# Patient Record
Sex: Female | Born: 2000 | Race: Black or African American | Hispanic: No | Marital: Single | Smoking: Never smoker
Health system: Southern US, Community
[De-identification: ages and names within clinical notes are randomized; demographics above are authoritative.]

## PROBLEM LIST (undated history)

## (undated) DIAGNOSIS — R569 Unspecified convulsions: Secondary | ICD-10-CM

## (undated) HISTORY — PX: DENTAL SURGERY: SHX609

---

## 2019-05-17 ENCOUNTER — Emergency Department (HOSPITAL_COMMUNITY)
Admission: EM | Admit: 2019-05-17 | Discharge: 2019-05-17 | Disposition: A | Discharge status: Home / Self Care | Payer: Medicaid - Out of State | Attending: Emergency Medicine | Admitting: Emergency Medicine

## 2019-05-17 ENCOUNTER — Emergency Department (HOSPITAL_COMMUNITY): Payer: Medicaid - Out of State

## 2019-05-17 ENCOUNTER — Encounter (HOSPITAL_COMMUNITY): Payer: Self-pay

## 2019-05-17 ENCOUNTER — Other Ambulatory Visit: Payer: Self-pay

## 2019-05-17 DIAGNOSIS — R55 Syncope and collapse: Secondary | ICD-10-CM | POA: Diagnosis not present

## 2019-05-17 DIAGNOSIS — R569 Unspecified convulsions: Secondary | ICD-10-CM | POA: Diagnosis not present

## 2019-05-17 HISTORY — DX: Unspecified convulsions: R56.9

## 2019-05-17 LAB — CBC WITH DIFFERENTIAL/PLATELET
Abs Immature Granulocytes: 0.01 10*3/uL (ref 0.00–0.07)
Basophils Absolute: 0 10*3/uL (ref 0.0–0.1)
Basophils Relative: 0 %
Eosinophils Absolute: 0 10*3/uL (ref 0.0–0.5)
Eosinophils Relative: 1 %
HCT: 37.4 % (ref 36.0–46.0)
Hemoglobin: 12.1 g/dL (ref 12.0–15.0)
Immature Granulocytes: 0 %
Lymphocytes Relative: 37 %
Lymphs Abs: 2.5 10*3/uL (ref 0.7–4.0)
MCH: 31.3 pg (ref 26.0–34.0)
MCHC: 32.4 g/dL (ref 30.0–36.0)
MCV: 96.6 fL (ref 80.0–100.0)
Monocytes Absolute: 0.4 10*3/uL (ref 0.1–1.0)
Monocytes Relative: 6 %
Neutro Abs: 4 10*3/uL (ref 1.7–7.7)
Neutrophils Relative %: 56 %
Platelets: 262 10*3/uL (ref 150–400)
RBC: 3.87 MIL/uL (ref 3.87–5.11)
RDW: 12.7 % (ref 11.5–15.5)
WBC: 6.9 10*3/uL (ref 4.0–10.5)
nRBC: 0 % (ref 0.0–0.2)

## 2019-05-17 LAB — COMPREHENSIVE METABOLIC PANEL
ALT: 38 U/L (ref 0–44)
AST: 35 U/L (ref 15–41)
Albumin: 3.9 g/dL (ref 3.5–5.0)
Alkaline Phosphatase: 72 U/L (ref 38–126)
Anion gap: 9 (ref 5–15)
BUN: 11 mg/dL (ref 6–20)
CO2: 23 mmol/L (ref 22–32)
Calcium: 9 mg/dL (ref 8.9–10.3)
Chloride: 106 mmol/L (ref 98–111)
Creatinine, Ser: 0.72 mg/dL (ref 0.44–1.00)
GFR calc Af Amer: >60 mL/min (ref >60)
GFR calc non Af Amer: >60 mL/min (ref >60)
Glucose, Bld: 90 mg/dL (ref 70–99)
Potassium: 3.9 mmol/L (ref 3.5–5.1)
Sodium: 138 mmol/L (ref 135–145)
Total Bilirubin: 0.3 mg/dL (ref 0.3–1.2)
Total Protein: 7.9 g/dL (ref 6.5–8.1)

## 2019-05-17 LAB — I-STAT BETA HCG BLOOD, ED (MC, WL, AP ONLY): I-stat hCG, quantitative: <5 m[IU]/mL (ref <5)

## 2019-05-17 MED ORDER — ACETAMINOPHEN 325 MG PO TABS
650.0000 mg | ORAL_TABLET | Freq: Once | ORAL | Status: AC
  Administered 2019-05-17: 650 mg via ORAL
  Filled 2019-05-17: qty 2

## 2019-05-17 NOTE — ED Notes (Signed)
Ambulatory to restroom

## 2019-05-17 NOTE — ED Triage Notes (Signed)
Pt says has history of seizures but says she takes her mom's seizure medication.   Pt says she doesn't know the name of the medication.  Pt says her boyfriend brought her to the ED today because she had a seizure at home.  Pt doesn't remember anything about this morning, c/o headache.  Pt has small abrasion to r forehead.

## 2019-05-17 NOTE — ED Provider Notes (Signed)
[facility] EMERGENCY DEPARTMENT Provider Note   CSN: [id number] Arrival date & time: 05/17/19  0932     History Chief Complaint  Patient presents with  . Seizures    Kate Larock is a 19 y.o. female.  Patient had episode that involved shaking and passing out.  She may have had a seizure.  The history is provided by the patient. No language interpreter was used.  Seizures Seizure activity on arrival: yes   Seizure type:  Focal Preceding symptoms: no sensation of an aura present   Initial focality:  None Episode characteristics: abnormal movements and confusion   Postictal symptoms: confusion   Return to baseline: no   Severity:  Mild      Past Medical History:  Diagnosis Date  . Seizures (HCC)     There are no problems to display for this patient.   Past Surgical History:  Procedure Laterality Date  . DENTAL SURGERY       OB History   No obstetric history on file.     No family history on file.  Social History   Tobacco Use  . Smoking status: Never Smoker  . Smokeless tobacco: Never Used  Substance Use Topics  . Alcohol use: Never  . Drug use: Never    Home Medications Prior to Admission medications   Not on File    Allergies    Patient has no known allergies.  Review of Systems   Review of Systems  Constitutional: Negative for appetite change and fatigue.  HENT: Negative for congestion, ear discharge and sinus pressure.   Eyes: Negative for discharge.  Respiratory: Negative for cough.   Cardiovascular: Negative for chest pain.  Gastrointestinal: Negative for abdominal pain and diarrhea.  Genitourinary: Negative for frequency and hematuria.  Musculoskeletal: Negative for back pain.  Skin: Negative for rash.  Neurological: Positive for seizures. Negative for headaches.  Psychiatric/Behavioral: Negative for hallucinations.    Physical Exam Updated Vital Signs BP 122/69   Pulse 75   Resp 15   Ht 5\' 3"  (1.6 m)   Wt 54.4 kg    LMP 05/12/2019 Comment: neg beta  SpO2 100%   BMI 21.26 kg/m   Physical Exam Vitals and nursing note reviewed.  Constitutional:      Appearance: She is well-developed.  HENT:     Head: Normocephalic.     Nose: Nose normal.  Eyes:     General: No scleral icterus.    Conjunctiva/sclera: Conjunctivae normal.  Neck:     Thyroid: No thyromegaly.  Cardiovascular:     Rate and Rhythm: Normal rate and regular rhythm.     Heart sounds: No murmur. No friction rub. No gallop.   Pulmonary:     Breath sounds: No stridor. No wheezing or rales.  Chest:     Chest wall: No tenderness.  Abdominal:     General: There is no distension.     Tenderness: There is no abdominal tenderness. There is no rebound.  Musculoskeletal:        General: Normal range of motion.     Cervical back: Neck supple.  Lymphadenopathy:     Cervical: No cervical adenopathy.  Skin:    Findings: No erythema or rash.  Neurological:     Mental Status: She is alert and oriented to person, place, and time.     Motor: No abnormal muscle tone.     Coordination: Coordination normal.     Comments: Mild confusion  Psychiatric:  Behavior: Behavior normal.     ED Results / Procedures / Treatments   Labs (all labs ordered are listed, but only abnormal results are displayed) Labs Reviewed  CBC WITH DIFFERENTIAL/PLATELET  COMPREHENSIVE METABOLIC PANEL  I-STAT BETA HCG BLOOD, ED ([facility], [facility], [facility] ONLY)    EKG None  Radiology CT Head Wo Contrast  Result Date: 05/17/2019 CLINICAL DATA:  Seizure. EXAM: CT HEAD WITHOUT CONTRAST TECHNIQUE: Contiguous axial images were obtained from the base of the skull through the vertex without intravenous contrast. COMPARISON:  01/15/2019 FINDINGS: Brain: No mass lesion, hemorrhage, hydrocephalus, acute infarct, intra-axial, or extra-axial fluid collection. Vascular: No hyperdense vessel or unexpected calcification. Skull: No significant soft tissue swelling.  No skull fracture.  Sinuses/Orbits: Normal imaged portions of the orbits and globes. Clear paranasal sinuses and mastoid air cells. Other: None. IMPRESSION: Normal head CT. Electronically Signed   By: Abigail Miyamoto M.D.   On: 05/17/2019 10:57    Procedures Procedures (including critical care time)  Medications Ordered in ED Medications  acetaminophen (TYLENOL) tablet 650 mg (650 mg Oral Given 05/17/19 1136)    ED Course  I have reviewed the triage vital signs and the nursing notes.  Pertinent labs & imaging results that were available during my care of the patient were reviewed by me and considered in my medical decision making (see chart for details).    MDM Rules/Calculators/A&P                      Labs and CT scan negative.. Patient is back to her normal self.  Patient states she seen a neurologist and was told she does not have seizures after many tests.  I told her to not drive and follow back up with her doctors and let them know she had another episode Final Clinical Impression(s) / ED Diagnoses Final diagnoses:  Syncope and collapse    Rx / DC Orders ED Discharge Orders    None       Milton Ferguson, MD 05/17/19 1153

## 2019-05-17 NOTE — Discharge Instructions (Addendum)
Follow-up with your doctor this week and let them know you had another episode.  Do not do any driving until your doctor says it is okay

## 2019-05-23 ENCOUNTER — Other Ambulatory Visit: Payer: Self-pay

## 2019-05-23 ENCOUNTER — Encounter (HOSPITAL_COMMUNITY): Payer: Self-pay

## 2019-05-23 ENCOUNTER — Emergency Department (HOSPITAL_COMMUNITY)
Admission: EM | Admit: 2019-05-23 | Discharge: 2019-05-23 | Disposition: A | Discharge status: Home / Self Care | Payer: PRIVATE HEALTH INSURANCE | Attending: Emergency Medicine | Admitting: Emergency Medicine

## 2019-05-23 DIAGNOSIS — Z79899 Other long term (current) drug therapy: Secondary | ICD-10-CM | POA: Insufficient documentation

## 2019-05-23 DIAGNOSIS — R519 Headache, unspecified: Secondary | ICD-10-CM | POA: Diagnosis not present

## 2019-05-23 DIAGNOSIS — R569 Unspecified convulsions: Secondary | ICD-10-CM | POA: Diagnosis not present

## 2019-05-23 LAB — BASIC METABOLIC PANEL
Anion gap: 8 (ref 5–15)
BUN: 11 mg/dL (ref 6–20)
CO2: 24 mmol/L (ref 22–32)
Calcium: 9.2 mg/dL (ref 8.9–10.3)
Chloride: 105 mmol/L (ref 98–111)
Creatinine, Ser: 0.71 mg/dL (ref 0.44–1.00)
GFR calc Af Amer: >60 mL/min (ref >60)
GFR calc non Af Amer: >60 mL/min (ref >60)
Glucose, Bld: 92 mg/dL (ref 70–99)
Potassium: 3.6 mmol/L (ref 3.5–5.1)
Sodium: 137 mmol/L (ref 135–145)

## 2019-05-23 LAB — CBC WITH DIFFERENTIAL/PLATELET
Abs Immature Granulocytes: 0.02 10*3/uL (ref 0.00–0.07)
Basophils Absolute: 0 10*3/uL (ref 0.0–0.1)
Basophils Relative: 0 %
Eosinophils Absolute: 0 10*3/uL (ref 0.0–0.5)
Eosinophils Relative: 0 %
HCT: 40.1 % (ref 36.0–46.0)
Hemoglobin: 12.7 g/dL (ref 12.0–15.0)
Immature Granulocytes: 0 %
Lymphocytes Relative: 19 %
Lymphs Abs: 1.3 10*3/uL (ref 0.7–4.0)
MCH: 30.2 pg (ref 26.0–34.0)
MCHC: 31.7 g/dL (ref 30.0–36.0)
MCV: 95.5 fL (ref 80.0–100.0)
Monocytes Absolute: 0.4 10*3/uL (ref 0.1–1.0)
Monocytes Relative: 5 %
Neutro Abs: 5.2 10*3/uL (ref 1.7–7.7)
Neutrophils Relative %: 76 %
Platelets: 250 10*3/uL (ref 150–400)
RBC: 4.2 MIL/uL (ref 3.87–5.11)
RDW: 12.9 % (ref 11.5–15.5)
WBC: 6.9 10*3/uL (ref 4.0–10.5)
nRBC: 0 % (ref 0.0–0.2)

## 2019-05-23 LAB — HCG, QUANTITATIVE, PREGNANCY: hCG, Beta Chain, Quant, S: <1 m[IU]/mL (ref <5)

## 2019-05-23 MED ORDER — ACETAMINOPHEN 500 MG PO TABS
1000.0000 mg | ORAL_TABLET | Freq: Once | ORAL | Status: AC
  Administered 2019-05-23: 1000 mg via ORAL
  Filled 2019-05-23: qty 2

## 2019-05-23 NOTE — ED Triage Notes (Signed)
Pt states she has been waiting on her seizure medication. Pt saw neuro in [place] last Tuesday and still does not have seizure medication. Boyfriend witnessed seizure today . Pt was twitching and blacked out

## 2019-05-23 NOTE — ED Provider Notes (Signed)
[facility] EMERGENCY DEPARTMENT Provider Note   CSN: [id number] Arrival date & time: 05/23/19  1621     History Chief Complaint  Patient presents with  . Seizures    Emily Alexander is a 19 y.o. female with a new onset of seizure disorder under the care of Dr Donnetta Simpers in [place], presenting with a seizure which occurred prior to arrival.  She was sleeping prior to going tp work this evening, had just woke when she developed "twitching" followed by becoming unresponsive. She is unsure of the length of time of this seizure but was awake and remembers the ems personal bringing her here. She saw her new neurologist 3 days ago and prescribed keppra 500 mg twice daily.  Prior to this she had not been on medication for seizures.    She denies injury, she does endorse mild headache and drowsiness.  She had no urinary incontinence.    The history is provided by the patient.       Past Medical History:  Diagnosis Date  . Seizures (HCC)     There are no problems to display for this patient.   Past Surgical History:  Procedure Laterality Date  . DENTAL SURGERY       OB History   No obstetric history on file.     No family history on file.  Social History   Tobacco Use  . Smoking status: Never Smoker  . Smokeless tobacco: Never Used  Substance Use Topics  . Alcohol use: Never  . Drug use: Never    Home Medications Prior to Admission medications   Medication Sig Start Date End Date Taking? Authorizing Provider  acetaminophen (TYLENOL) 500 MG tablet Take 500 mg by mouth every 6 (six) hours as needed for mild pain or moderate pain.   Yes [provider]  levETIRAcetam (KEPPRA) 500 MG tablet Take 500 mg by mouth See admin instructions. Take 500mg  twice daily for 1 week, then increase to 1000mg  twice daily thereafter   Yes [provider]    Allergies    Patient has no known allergies.  Review of Systems   Review of Systems  Constitutional: Negative for  chills and fever.  HENT: Negative.  Negative for congestion and sore throat.   Eyes: Negative.   Respiratory: Negative for chest tightness and shortness of breath.   Cardiovascular: Negative for chest pain.  Gastrointestinal: Negative for abdominal pain, nausea and vomiting.  Genitourinary: Negative.   Musculoskeletal: Negative for arthralgias, joint swelling and neck pain.  Skin: Negative.  Negative for rash and wound.  Neurological: Positive for headaches. Negative for dizziness, weakness, light-headedness and numbness.  Psychiatric/Behavioral: Negative.     Physical Exam Updated Vital Signs BP 114/83 (BP Location: Left Arm)   Pulse 72   Temp 98.5 F (36.9 C)   Resp 18   Wt 54 kg   LMP 05/05/2019   SpO2 95%   BMI 21.09 kg/m   Physical Exam Vitals and nursing note reviewed.  Constitutional:      Appearance: She is well-developed.     Comments: Appears tired   HENT:     Head: Normocephalic and atraumatic.     Comments: No mouth or tongue injury. Eyes:     Extraocular Movements: Extraocular movements intact.     Pupils: Pupils are equal, round, and reactive to light.  Cardiovascular:     Rate and Rhythm: Normal rate and regular rhythm.     Heart sounds: Normal heart sounds.  Pulmonary:  Effort: Pulmonary effort is normal.     Breath sounds: Normal breath sounds. No wheezing.  Musculoskeletal:        General: Normal range of motion.     Cervical back: Normal range of motion and neck supple.  Lymphadenopathy:     Cervical: No cervical adenopathy.  Skin:    General: Skin is warm and dry.     Findings: No rash.  Neurological:     General: No focal deficit present.     Mental Status: She is alert and oriented to person, place, and time.     GCS: GCS eye subscore is 4. GCS verbal subscore is 5. GCS motor subscore is 6.     Cranial Nerves: No cranial nerve deficit.     Sensory: No sensory deficit.     Motor: No weakness.     Gait: Gait normal.     Comments:  Normal heel-shin, normal rapid alternating movements. Cranial nerves III-XII intact.  No pronator drift.  Psychiatric:        Speech: Speech normal.        Behavior: Behavior normal.        Thought Content: Thought content normal.     ED Results / Procedures / Treatments   Labs (all labs ordered are listed, but only abnormal results are displayed) Labs Reviewed  CBC WITH DIFFERENTIAL/PLATELET  BASIC METABOLIC PANEL  HCG, QUANTITATIVE, PREGNANCY    EKG EKG Interpretation  Date/Time:  Friday May 23 2019 18:37:18 EST Ventricular Rate:  87 PR Interval:    QRS Duration: 82 QT Interval:  367 QTC Calculation: 442 R Axis:   83 Text Interpretation: Sinus rhythm Abnormal ECG No old tracing to compare Confirmed by [place]  MD, Juanda Crumble [phone]) on 05/23/2019 6:55:11 PM   Radiology No results found.  Procedures Procedures (including critical care time)  Medications Ordered in ED Medications  acetaminophen (TYLENOL) tablet 1,000 mg (1,000 mg Oral Given 05/23/19 2037)    ED Course  I have reviewed the triage vital signs and the nursing notes.  Pertinent labs & imaging results that were available during my care of the patient were reviewed by me and considered in my medical decision making (see chart for details).    MDM Rules/Calculators/A&P                      Labs reviewed and discussed with patient.  Patient is awake and alert, tired but not post ictal.  She was advised to continue taking this new medication as prescribed.  Also advised to follow-up with her neurologist informing him of another seizure.  We discussed the need to avoid any dangerous activities including driving for 6 months or until advised by her neurologist.  The patient appears reasonably screened and/or stabilized for discharge and I doubt any other medical condition or other [facility] requiring further screening, evaluation, or treatment in the ED at this time prior to discharge.  Final Clinical Impression(s) /  ED Diagnoses Final diagnoses:  Seizure [facility])    Rx / [place] Orders ED Discharge Orders    None       Landis Martins 05/24/19 0134    Truddie Hidden, MD 05/24/19 1331

## 2019-05-23 NOTE — ED Notes (Signed)
Patient used restroom return to bed at this time. Vitals WNL

## 2019-05-23 NOTE — Discharge Instructions (Addendum)
Get started on your seizure medicine now that you have obtained it from your pharmacy.  Your lab tests tonight are normal.  Your exam is also reassuring.  Please be aware that it is illegal to drive within 6 months of having a seizure as this is potentially dangerous.  Also I recommend avoiding any other dangerous activities where you might fall and injure yourself if you were to have a seizure.

## 2019-06-19 ENCOUNTER — Emergency Department (HOSPITAL_COMMUNITY)
Admission: EM | Admit: 2019-06-19 | Discharge: 2019-06-19 | Disposition: A | Discharge status: Home / Self Care | Payer: Medicaid - Out of State | Attending: Emergency Medicine | Admitting: Emergency Medicine

## 2019-06-19 ENCOUNTER — Encounter (HOSPITAL_COMMUNITY): Payer: Self-pay | Admitting: Emergency Medicine

## 2019-06-19 ENCOUNTER — Other Ambulatory Visit: Payer: Self-pay

## 2019-06-19 DIAGNOSIS — R251 Tremor, unspecified: Secondary | ICD-10-CM

## 2019-06-19 DIAGNOSIS — G40909 Epilepsy, unspecified, not intractable, without status epilepticus: Secondary | ICD-10-CM | POA: Insufficient documentation

## 2019-06-19 DIAGNOSIS — Z79899 Other long term (current) drug therapy: Secondary | ICD-10-CM | POA: Insufficient documentation

## 2019-06-19 LAB — I-STAT CHEM 8, ED
BUN: 11 mg/dL (ref 6–20)
Calcium, Ion: 1.19 mmol/L (ref 1.15–1.40)
Chloride: 105 mmol/L (ref 98–111)
Creatinine, Ser: 0.8 mg/dL (ref 0.44–1.00)
Glucose, Bld: 93 mg/dL (ref 70–99)
HCT: 39 % (ref 36.0–46.0)
Hemoglobin: 13.3 g/dL (ref 12.0–15.0)
Potassium: 3.8 mmol/L (ref 3.5–5.1)
Sodium: 140 mmol/L (ref 135–145)
TCO2: 26 mmol/L (ref 22–32)

## 2019-06-19 LAB — I-STAT BETA HCG BLOOD, ED (MC, WL, AP ONLY): I-stat hCG, quantitative: <5 m[IU]/mL (ref <5)

## 2019-06-19 MED ORDER — LORAZEPAM 2 MG/ML IJ SOLN
1.0000 mg | Freq: Once | INTRAMUSCULAR | Status: AC
  Administered 2019-06-19: 1 mg via INTRAVENOUS
  Filled 2019-06-19: qty 1

## 2019-06-19 NOTE — ED Triage Notes (Signed)
Per EMS, pt reports "I feel like I'm about to have a seizure." pt reports generalized tremors this am. Pt reports recently started on keppra. Pt reports took 1000mg  of keppra this am prior to EMS arrival.  Pt alert and oriented. Intermittent "trembling" noted in BUE. Airway patent. Speech clear. Seizure pads at bedside.

## 2019-06-19 NOTE — ED Notes (Signed)
Pt in bed, pt states that the jerking movements have stopped, pt requests water.  Eyes open, pt alert and acting appropriately

## 2019-06-19 NOTE — Discharge Instructions (Addendum)
Continue your current medications.  Follow up with your neurologist. 

## 2019-06-19 NOTE — ED Notes (Signed)
Pt in bed, pt denies pain, pt states that her headache is better, pt has sporadic jerking movements, iv placement unsuccessful times two, iv placed by second RN.

## 2019-06-19 NOTE — ED Provider Notes (Signed)
[facility] Provider Note   CSN: [id number] Arrival date & time: 06/19/19  1026     History Chief Complaint  Patient presents with  . Tremors    Emily Alexander is a 19 y.o. female.  HPI   Patient presented to the emergency room because she felt like she was going to have a seizure.  Patient was recently diagnosed with possible seizure disorder.  Patient was first seen in the emergency room on February 27.  At that time she had an episode of loss in consciousness that did involve shaking.  According to the records at that time the patient had been seen by neurology and it was not clear that she had a seizure disorder.  She has since seen a neurologist in [place].  She was started on Keppra.  Patient came back to the emergency room on March 5 for an episode of shaking followed by unresponsiveness.  Patient states she has been taking her medications.  Today when she was getting up this morning she had episodes of her whole body shaking.  Patient did not lose consciousness but she was worried she was going to have a seizure.  She denies any fevers or chills.  She does have a headache in the back of her head.  She denies any vomiting or diarrhea.  No other complaints.  Past Medical History:  Diagnosis Date  . Seizures ([place])     There are no problems to display for this patient.   Past Surgical History:  Procedure Laterality Date  . DENTAL SURGERY       OB History   No obstetric history on file.     History reviewed. No pertinent family history.  Social History   Tobacco Use  . Smoking status: Never Smoker  . Smokeless tobacco: Never Used  Substance Use Topics  . Alcohol use: Never  . Drug use: Never    Home Medications Prior to Admission medications   Medication Sig Start Date End Date Taking? Authorizing Provider  levETIRAcetam (KEPPRA) 500 MG tablet Take 1,000 mg by mouth 2 (two) times daily.    Yes [provider]  acetaminophen  (TYLENOL) 500 MG tablet Take 500 mg by mouth every 6 (six) hours as needed for mild pain or moderate pain.    [provider]    Allergies    Patient has no known allergies.  Review of Systems   Review of Systems  All other systems reviewed and are negative.   Physical Exam Updated Vital Signs BP 127/82   Pulse 74   Temp 98.1 F (36.7 C) (Oral)   Resp 16   Ht 1.6 m (5\' 3" )   Wt 54.4 kg   LMP 06/15/2019   SpO2 96%   BMI 21.26 kg/m   Physical Exam Vitals and nursing note reviewed.  Constitutional:      General: She is not in acute distress.    Appearance: She is well-developed.  HENT:     Head: Normocephalic and atraumatic.     Right Ear: External ear normal.     Left Ear: External ear normal.  Eyes:     General: No scleral icterus.       Right eye: No discharge.        Left eye: No discharge.     Conjunctiva/sclera: Conjunctivae normal.  Neck:     Trachea: No tracheal deviation.  Cardiovascular:     Rate and Rhythm: Normal rate and regular rhythm.  Pulmonary:  Effort: Pulmonary effort is normal. No respiratory distress.     Breath sounds: Normal breath sounds. No stridor. No wheezing or rales.  Abdominal:     General: Bowel sounds are normal. There is no distension.     Palpations: Abdomen is soft.     Tenderness: There is no abdominal tenderness. There is no guarding or rebound.  Musculoskeletal:        General: No tenderness.     Cervical back: Neck supple.  Skin:    General: Skin is warm and dry.     Findings: No rash.  Neurological:     Mental Status: She is alert and oriented to person, place, and time.     Cranial Nerves: No cranial nerve deficit (no facial droop, extraocular movements intact, no slurred speech).     Sensory: No sensory deficit.     Motor: Tremor present. No abnormal muscle tone.     Coordination: Coordination normal.     Comments: Occasional tremulousness with her entire body twitching, patient remains conscious, normal  strength in all extremities, no facial droop, normal finger-to-nose exam     ED Results / Procedures / Treatments   Labs (all labs ordered are listed, but only abnormal results are displayed) Labs Reviewed  I-STAT CHEM 8, ED  I-STAT BETA HCG BLOOD, ED ([facility], [facility], [facility] ONLY)    EKG None  Radiology No results found.  Procedures Procedures (including critical care time)  Medications Ordered in ED Medications  LORazepam (ATIVAN) injection 1 mg (1 mg Intravenous Given 06/19/19 1103)    ED Course  I have reviewed the triage vital signs and the nursing notes.  Pertinent labs & imaging results that were available during my care of the patient were reviewed by me and considered in my medical decision making (see chart for details).  Clinical Course as of Jun 18 1244  Thu Jun 19, 2019  1245 Laboratory tests reviewed.  Normal   [JK]  1245 Patient has remained stable in the ED.  No recurrent seizure-like activity   [JK]    Clinical Course User Index [JK] Linwood Dibbles, MD   MDM Rules/Calculators/A&P                      Patient presented to ED with complaints of tremors concerning for possible recurrent seizure.  Patient remained alert and awake.  She did have a myoclonic spasm during my initial exam.  No typical tonic-clonic activity.  Patient has remained stable without any seizures.  No signs of acute infection or other neurologic abnormality.  Patient appears stable to follow-up with her neurologist as an outpatient. Final Clinical Impression(s) / ED Diagnoses Final diagnoses:  Tremor  Seizure disorder [facility])    Rx / DC Orders ED Discharge Orders    None       Linwood Dibbles, MD 06/19/19 1246

## 2021-10-12 IMAGING — CT CT HEAD W/O CM
3 series · 15 of 46 positions shown, 18 images · non-contrast
Comparison: 01/15/2019

CLINICAL DATA: Seizure.

EXAM:
CT HEAD WITHOUT CONTRAST
TECHNIQUE: Contiguous axial images were obtained from the base of the skull
through the vertex without intravenous contrast.

[Series 2: head w o · axial · 0.42mm/px · z∈[-23,+97]mm · 9 of 29 slices shown, 12 images]
[im 3/29  brain]
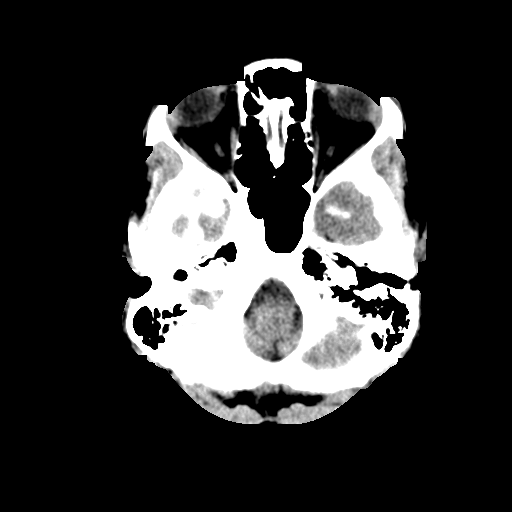
[im 3/29  bone]
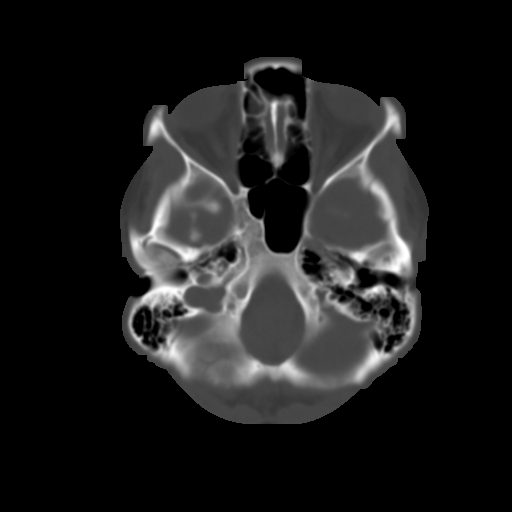
[im 6/29  brain]
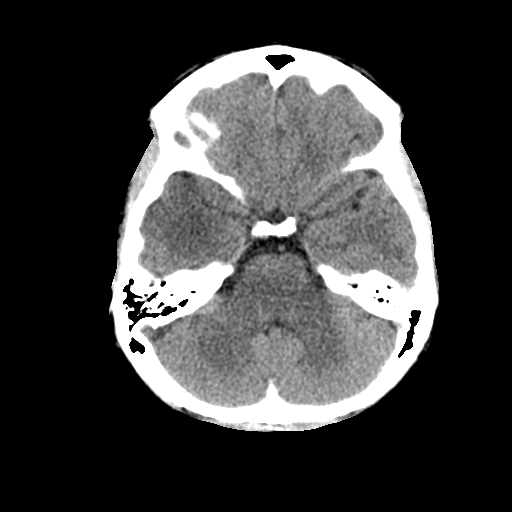
[im 9/29  brain]
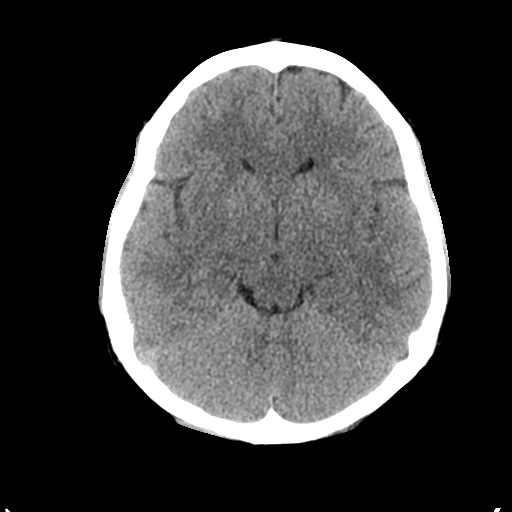
[im 12/29  brain]
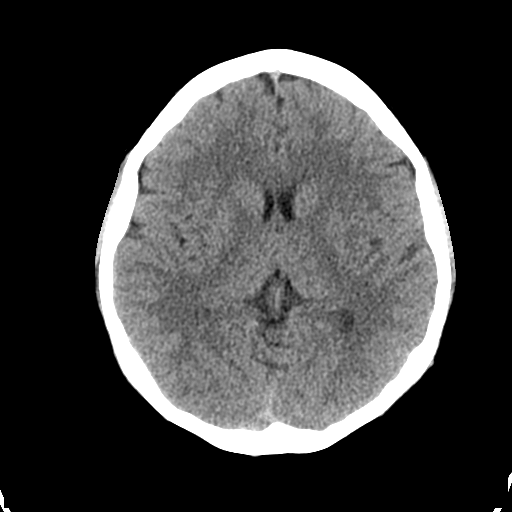
[im 15/29  brain]
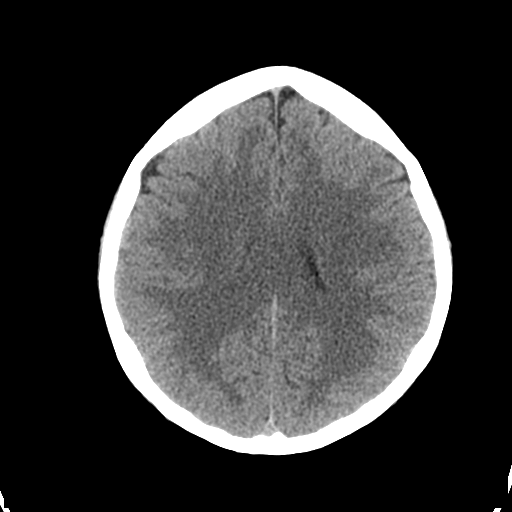
[im 15/29  bone]
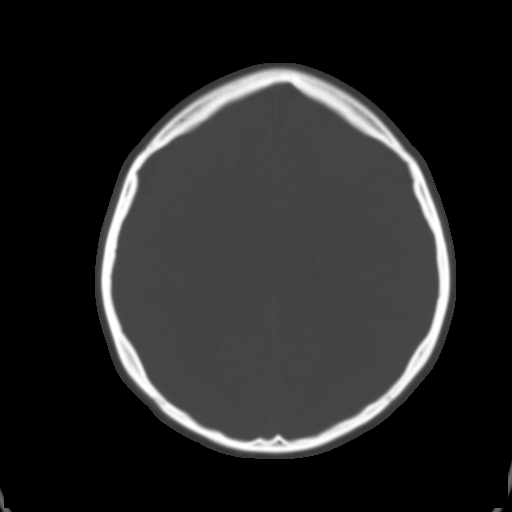
[im 18/29  brain]
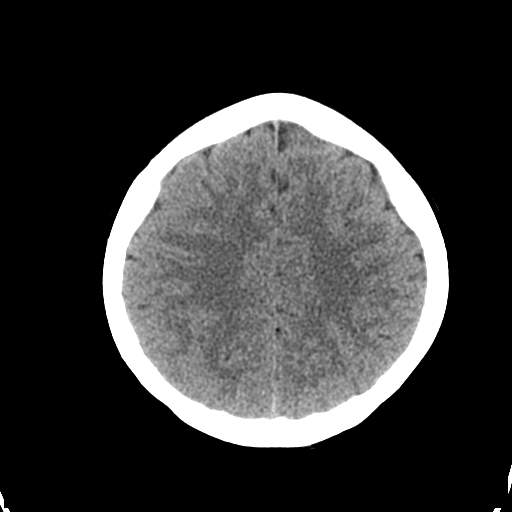
[im 21/29  brain]
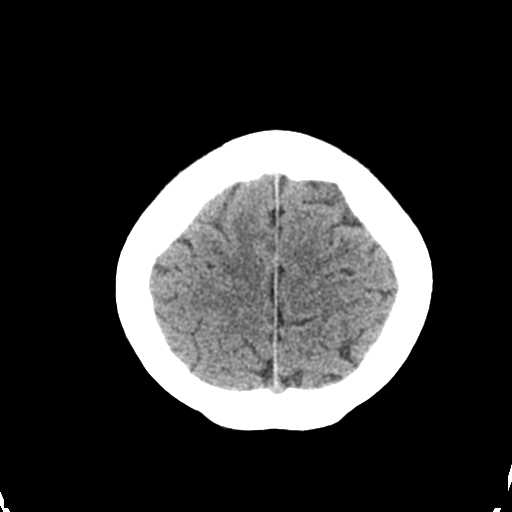
[im 24/29  brain]
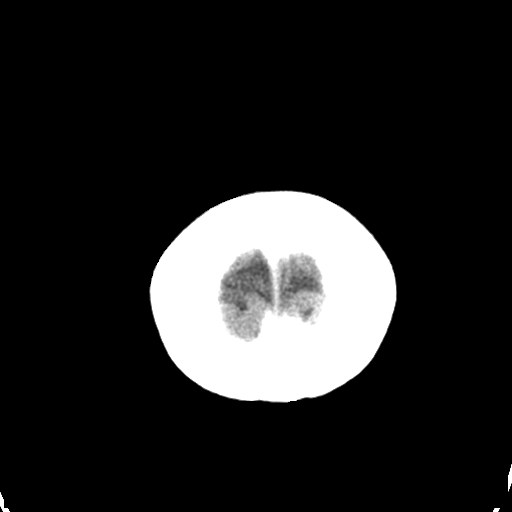
[im 27/29  brain]
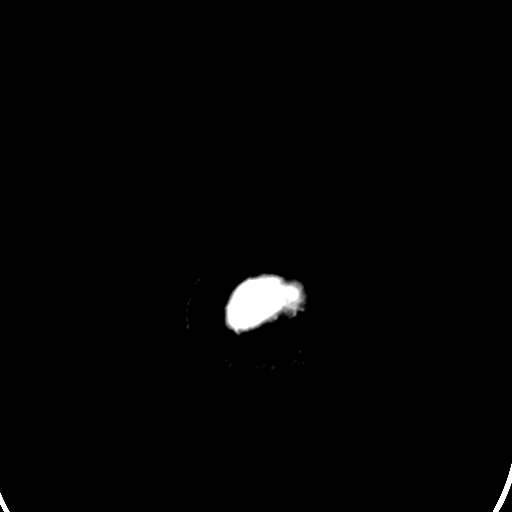
[im 27/29  bone]
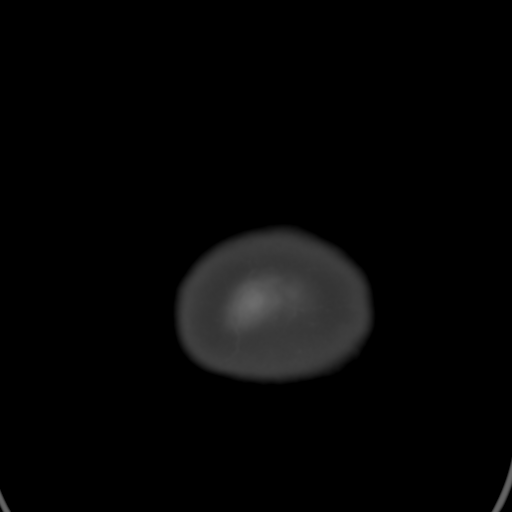

[Series 4: coronal soft · coronal · 0.29mm/px · 3 of 66 slices shown]
[im 22/66  brain]
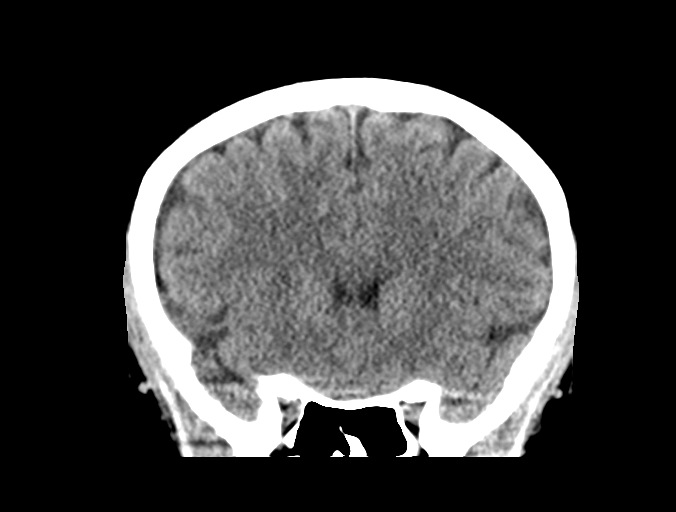
[im 29/66  brain]
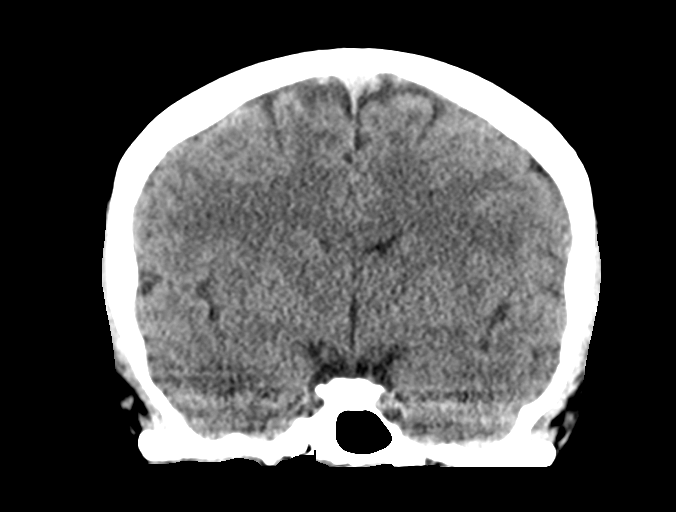
[im 37/66  brain]
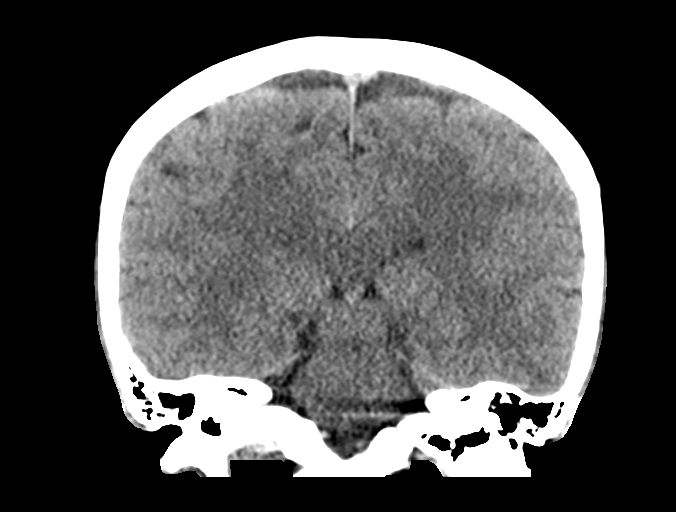

[Series 5: sagittal soft · sagittal · 0.27mm/px · 3 of 61 slices shown]
[im 21/61  brain]
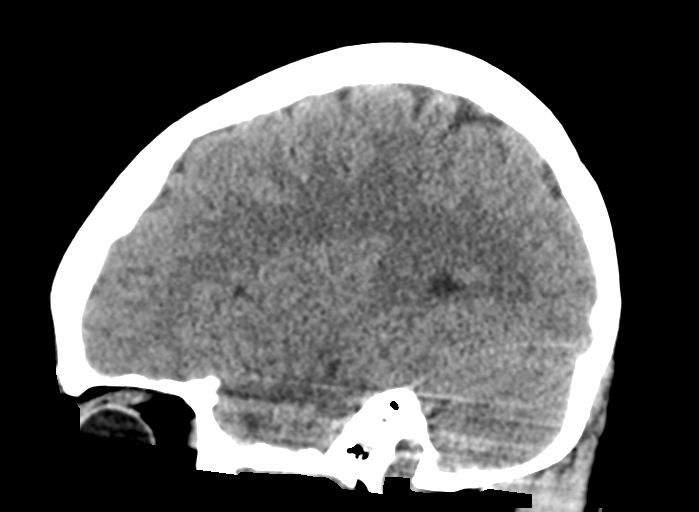
[im 31/61  brain]
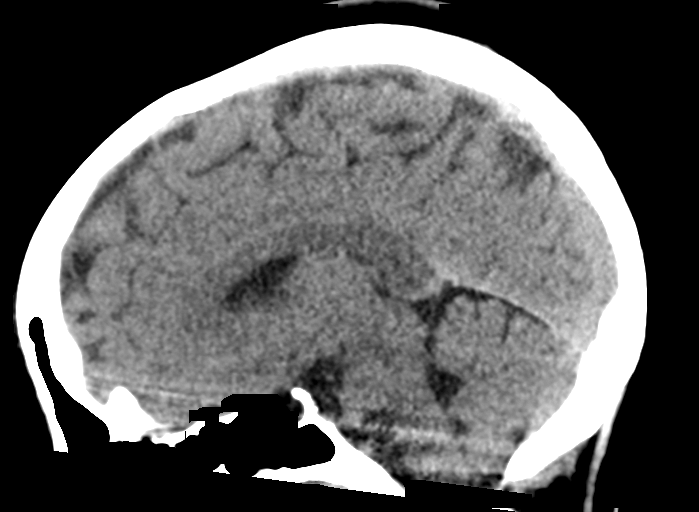
[im 41/61  brain]
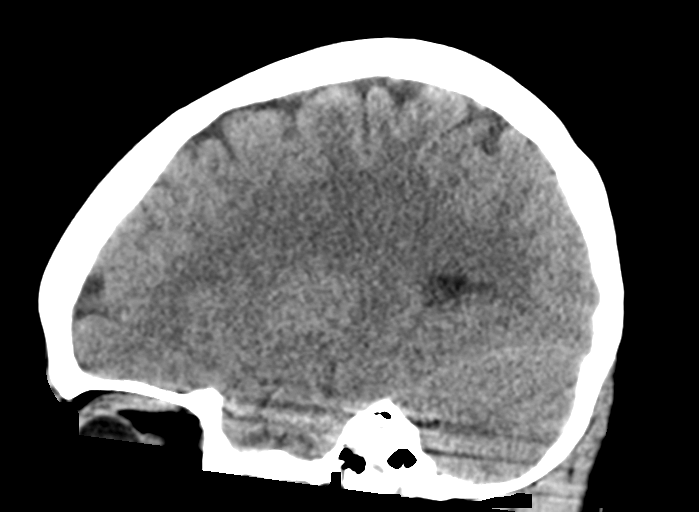

[15 of 46 positions shown; findings below may reference images not displayed]

FINDINGS: Brain: No mass lesion, hemorrhage, hydrocephalus, acute infarct,
intra-axial, or extra-axial fluid collection.

Vascular: No hyperdense vessel or unexpected calcification.

Skull: No significant soft tissue swelling.  No skull fracture.

Sinuses/Orbits: Normal imaged portions of the orbits and globes.
Clear paranasal sinuses and mastoid air cells.

Other: None.
IMPRESSION: Normal head CT.

## 2021-10-20 ENCOUNTER — Encounter (HOSPITAL_COMMUNITY): Payer: Self-pay

## 2021-10-20 ENCOUNTER — Emergency Department (HOSPITAL_COMMUNITY)
Admission: EM | Admit: 2021-10-20 | Discharge: 2021-10-20 | Disposition: A | Discharge status: Home / Self Care | Payer: Medicaid - Out of State | Attending: Emergency Medicine | Admitting: Emergency Medicine

## 2021-10-20 DIAGNOSIS — K0889 Other specified disorders of teeth and supporting structures: Secondary | ICD-10-CM | POA: Insufficient documentation

## 2021-10-20 MED ORDER — BUPIVACAINE HCL (PF) 0.5 % IJ SOLN
50.0000 mL | Freq: Once | INTRAMUSCULAR | Status: AC
  Administered 2021-10-20: 50 mL
  Filled 2021-10-20: qty 60

## 2021-10-20 MED ORDER — BUPIVACAINE HCL 0.5 % IJ SOLN: 50.0000 mL | Freq: Once | INTRAMUSCULAR | Status: DC

## 2021-10-20 MED ORDER — AMOXICILLIN 500 MG PO CAPS: 500.0000 mg | ORAL_CAPSULE | Freq: Three times a day (TID) | ORAL | 0 refills | Status: AC

## 2021-10-20 MED ORDER — NAPROXEN 500 MG PO TABS: 500.0000 mg | ORAL_TABLET | Freq: Two times a day (BID) | ORAL | 0 refills | Status: AC

## 2021-10-20 MED ORDER — AMOXICILLIN 500 MG PO CAPS
500.0000 mg | ORAL_CAPSULE | Freq: Once | ORAL | Status: AC
  Administered 2021-10-20: 500 mg via ORAL
  Filled 2021-10-20: qty 1

## 2021-10-20 NOTE — ED Provider Notes (Signed)
[facility]-EMERGENCY DEPT [facility] Emergency Department Provider Note MRN:  [id number]  Arrival date & time: 10/20/21     Chief Complaint   Dental Pain   History of Present Illness   Emily Alexander is a 21 y.o. year-old female with a history of seizures presenting to the ED with chief complaint of dental pain.  Pain of tooth #27 for the past several days.  No fever, no other complaints.  Review of Systems  A thorough review of systems was obtained and all systems are negative except as noted in the HPI and PMH.   Patient's Health History    Past Medical History:  Diagnosis Date   Seizures [facility])     Past Surgical History:  Procedure Laterality Date   DENTAL SURGERY      History reviewed. No pertinent family history.  Social History   Socioeconomic History   Marital status: Single    Spouse name: Not on file   Number of children: Not on file   Years of education: Not on file   Highest education level: Not on file  Occupational History   Not on file  Tobacco Use   Smoking status: Never   Smokeless tobacco: Never  Vaping Use   Vaping Use: Never used  Substance and Sexual Activity   Alcohol use: Never   Drug use: Never   Sexual activity: Not on file  Other Topics Concern   Not on file  Social History Narrative   Not on file   Social Determinants of Health   Financial Resource Strain: Not on file  Food Insecurity: Not on file  Transportation Needs: Not on file  Physical Activity: Not on file  Stress: Not on file  Social Connections: Not on file  Intimate Partner Violence: Not on file     Physical Exam   Vitals:   10/20/21 0425  BP: (!) 122/90  Pulse: 91  Resp: 16  Temp: 98.6 F (37 C)  SpO2: 100%    CONSTITUTIONAL: Well-appearing, NAD NEURO/PSYCH:  Alert and oriented x 3, no focal deficits EYES:  eyes equal and reactive ENT/NECK:  no LAD, no JVD CARDIO: Regular rate, well-perfused, normal S1 and S2 PULM:  CTAB no wheezing or  rhonchi GI/GU:  non-distended, non-tender MSK/SPINE:  No gross deformities, no edema SKIN:  no rash, atraumatic   *Additional and/or pertinent findings included in MDM below  Diagnostic and Interventional Summary    EKG Interpretation  Date/Time:    Ventricular Rate:    PR Interval:    QRS Duration:   QT Interval:    QTC Calculation:   R Axis:     Text Interpretation:         Labs Reviewed - No data to display  No orders to display    Medications  amoxicillin (AMOXIL) capsule 500 mg (has no administration in time range)  bupivacaine(PF) (MARCAINE) 0.5 % injection 50 mL (has no administration in time range)     Procedures  /  Critical Care .Nerve Block  Date/Time: 10/20/2021 4:39 AM  Performed by: Sabas Sous, MD Authorized by: Sabas Sous, MD   Consent:    Consent obtained:  Verbal   Consent given by:  Patient   Risks, benefits, and alternatives were discussed: yes     Risks discussed:  Allergic reaction, bleeding, intravenous injection, infection, nerve damage, pain, unsuccessful block and swelling   Alternatives discussed:  No treatment Universal protocol:    Procedure explained and questions answered to patient  or proxy's satisfaction: yes     Immediately prior to procedure, a time out was called: yes     Patient identity confirmed:  Verbally with patient Indications:    Indications:  Pain relief Location:    Nerve block body site: Dental block, periapical.   Laterality:  Right Procedure details:    Block needle gauge:  27 G   Anesthetic injected:  Bupivacaine 0.5% w/o epi   Injection procedure:  Anatomic landmarks identified, anatomic landmarks palpated, negative aspiration for blood, introduced needle and incremental injection   Paresthesia:  None Post-procedure details:    Dressing:  None   Outcome:  Pain relieved   Procedure completion:  Tolerated well, no immediate complications   ED Course and Medical Decision Making  Initial Impression  and Ddx Tooth pain, no signs of gingival abscess or any other more significant contiguous infection.  There is an obvious cavity to the tooth with tenderness to palpation.  Pain management options discussed, will proceed with dental block.  Past medical/surgical history that increases complexity of ED encounter: None  Interpretation of Diagnostics Laboratory and/or imaging options to aid in the diagnosis/care of the patient were considered.  After careful history and physical examination, it was determined that there was no indication for diagnostics at this time.  Patient Reassessment and Ultimate Disposition/Management     Appropriate for discharge  Patient management required discussion with the following services or consulting groups:  None  Complexity of Problems Addressed Acute complicated illness or Injury  Additional Data Reviewed and Analyzed Further history obtained from: None  Additional Factors Impacting ED Encounter Risk Minor Procedures  Elmer Sow. Pilar Plate, MD [facility] Emergency Medicine [facility] [email or web address]  Final Clinical Impressions(s) / ED Diagnoses     ICD-10-CM   1. Pain, dental  K08.89       ED Discharge Orders     None        Discharge Instructions Discussed with and Provided to Patient:   Discharge Instructions   None      Sabas Sous, MD 10/20/21 [phone]

## 2021-10-20 NOTE — Discharge Instructions (Signed)
You were evaluated in the Emergency Department and after careful evaluation, we did not find any emergent condition requiring admission or further testing in the hospital.  Your exam/testing today is overall reassuring.  Symptoms seem to be due to a tooth infection.  Take the amoxicillin antibiotic as directed.  Recommend using the Naprosyn anti-inflammatory twice daily for pain as needed.  Please return to the Emergency Department if you experience any worsening of your condition.   Thank you for allowing [place] to be a part of your care.

## 2021-10-20 NOTE — ED Triage Notes (Signed)
Patient arrived with complaints of right lower dental pain that started yesterday. Took something for pain last at 1900

## 2021-10-24 ENCOUNTER — Other Ambulatory Visit (HOSPITAL_BASED_OUTPATIENT_CLINIC_OR_DEPARTMENT_OTHER): Payer: Self-pay

## 2021-11-06 ENCOUNTER — Emergency Department (HOSPITAL_COMMUNITY)
Admission: EM | Admit: 2021-11-06 | Discharge: 2021-11-06 | Disposition: A | Discharge status: Home / Self Care | Payer: Medicaid - Out of State | Attending: Emergency Medicine | Admitting: Emergency Medicine

## 2021-11-06 ENCOUNTER — Encounter (HOSPITAL_COMMUNITY): Payer: Self-pay | Admitting: Emergency Medicine

## 2021-11-06 DIAGNOSIS — K0889 Other specified disorders of teeth and supporting structures: Secondary | ICD-10-CM | POA: Insufficient documentation

## 2021-11-06 MED ORDER — PENICILLIN V POTASSIUM 500 MG PO TABS: 500.0000 mg | ORAL_TABLET | Freq: Three times a day (TID) | ORAL | 0 refills | Status: AC

## 2021-11-06 MED ORDER — BUPIVACAINE-EPINEPHRINE (PF) 0.5% -1:200000 IJ SOLN
1.8000 mL | Freq: Once | INTRAMUSCULAR | Status: AC
  Administered 2021-11-06: 1.8 mL
  Filled 2021-11-06: qty 1.8

## 2021-11-06 MED ORDER — IBUPROFEN 600 MG PO TABS: 600.0000 mg | ORAL_TABLET | Freq: Four times a day (QID) | ORAL | 0 refills | Status: AC | PRN

## 2021-11-06 NOTE — Discharge Instructions (Addendum)
Please follow up with a dentist for further management of your dental pain.

## 2021-11-06 NOTE — ED Triage Notes (Signed)
Pt endorses right sided lower dental pain for a few weeks. Pt went to dentist but did not take her insurance. Given an abx last time she was seen here.

## 2021-11-06 NOTE — ED Provider Notes (Signed)
[facility]-EMERGENCY DEPT Provider Note   CSN: [id number] Arrival date & time: 11/06/21  1730     History  Chief Complaint  Patient presents with   Dental Pain    Emily Alexander is a 21 y.o. female.  The history is provided by the patient and medical records. No language interpreter was used.  Dental Pain    21 year old female presenting complaint of dental pain.  Patient endorsed persistent pain to her right lower tooth ongoing for several weeks.  Pain is sharp throbbing worse with chewing with eating and with temperature changes.  No fever no trouble swallowing no neck pain.  She was seen recently on 10/20/21 for her dental pain and was given antibiotic and pain medication.  It has not helped much as the pain still persist.  She is unable to follow-up with a dentist due to lack of insurance.  She is here requesting for pain relief.  Home Medications Prior to Admission medications   Medication Sig Start Date End Date Taking? Authorizing Provider  acetaminophen (TYLENOL) 500 MG tablet Take 500 mg by mouth every 6 (six) hours as needed for mild pain or moderate pain.    [provider]  levETIRAcetam (KEPPRA) 500 MG tablet Take 1,000 mg by mouth 2 (two) times daily.     [provider]  naproxen (NAPROSYN) 500 MG tablet Take 1 tablet (500 mg total) by mouth 2 (two) times daily. 10/20/21   Sabas Sous, MD      Allergies    Patient has no known allergies.    Review of Systems   Review of Systems  All other systems reviewed and are negative.   Physical Exam Updated Vital Signs BP (!) 135/93   Pulse 83   Temp 98.8 F (37.1 C) (Oral)   Resp 15   Ht 5\' 3"  (1.6 m)   Wt 55 kg   LMP 09/28/2021   SpO2 100%   BMI 21.48 kg/m  Physical Exam Vitals and nursing note reviewed.  Constitutional:      Appearance: She is well-developed.     Comments: Patient is tearful  HENT:     Head: Atraumatic.     Mouth/Throat:     Comments:  Tenderness to tooth #27 on palpation without obvious abscess or gingival erythema noted. Eyes:     Conjunctiva/sclera: Conjunctivae normal.  Pulmonary:     Effort: Pulmonary effort is normal.  Musculoskeletal:     Cervical back: Neck supple.  Skin:    Findings: No rash.  Neurological:     Mental Status: She is alert.  Psychiatric:        Mood and Affect: Mood normal.     ED Results / Procedures / Treatments   Labs (all labs ordered are listed, but only abnormal results are displayed) Labs Reviewed - No data to display  EKG None  Radiology No results found.  Procedures .Nerve Block  Date/Time: 11/06/2021 9:16 PM  Performed by: 11/08/2021, PA-C Authorized by: Fayrene Helper, PA-C   Consent:    Consent obtained:  Verbal   Consent given by:  Patient   Risks discussed:  Allergic reaction, nerve damage and unsuccessful block   Alternatives discussed:  No treatment Universal protocol:    Procedure explained and questions answered to patient or proxy's satisfaction: yes     Relevant documents present and verified: yes     Patient identity confirmed:  Verbally with patient and arm band Indications:    Indications:  Pain relief Location:    Nerve block body site: dental block: periapical tooth #27.   Laterality:  Right Skin anesthesia:    Skin anesthesia method:  Topical application   Topical anesthetic:  Benzocaine gel Procedure details:    Block needle gauge:  27 G   Anesthetic injected:  Bupivacaine 0.25% w/o epi   Steroid injected:  None   Additive injected:  None   Injection procedure:  Incremental injection and introduced needle   Paresthesia:  Immediately resolved Post-procedure details:    Dressing:  None   Outcome:  Anesthesia achieved   Procedure completion:  Tolerated well, no immediate complications     Medications Ordered in ED Medications - No data to display  ED Course/ Medical Decision Making/ A&P                           Medical Decision  Making Risk Prescription drug management.   BP (!) 135/93   Pulse 83   Temp 98.8 F (37.1 C) (Oral)   Resp 15   Ht 5\' 3"  (1.6 m)   Wt 55 kg   LMP 09/28/2021   SpO2 100%   BMI 21.48 kg/m   8:41 PM Patient here with recurrent dental pain.  Involved tooth is tooth #27.  Was seen 2 weeks ago for her complaint and was prescribed antibiotic and pain medication.  She has finished her treatment and unable to follow-up with a dentist due to lack of insurance.  She is here due to worsening pain.  On exam she has tenderness to tooth #27 without obvious dental decay.  No obvious facial swelling or abscess noted.  After discussion, agrees to provide digital nerve block for symptom control.  9:17 PM Dental nerve block performed by me and pt tolerates well.  Will d/c home with abx, nsaids and dental referral.         Final Clinical Impression(s) / ED Diagnoses Final diagnoses:  Pain, dental    Rx / DC Orders ED Discharge Orders     None         11/29/2021, PA-C 11/06/21 2119    2120, MD 11/06/21 2242

## 2024-04-10 ENCOUNTER — Emergency Department (HOSPITAL_COMMUNITY)
Admission: EM | Admit: 2024-04-10 | Discharge: 2024-04-10 | Disposition: A | Discharge status: Home / Self Care | Payer: Self-pay | Attending: Emergency Medicine | Admitting: Emergency Medicine

## 2024-04-10 ENCOUNTER — Encounter (HOSPITAL_COMMUNITY): Payer: Self-pay | Admitting: Pharmacy Technician

## 2024-04-10 ENCOUNTER — Other Ambulatory Visit: Payer: Self-pay

## 2024-04-10 DIAGNOSIS — R569 Unspecified convulsions: Secondary | ICD-10-CM | POA: Insufficient documentation

## 2024-04-10 LAB — COMPREHENSIVE METABOLIC PANEL WITH GFR
ALT: 9 U/L (ref 0–44)
AST: 18 U/L (ref 15–41)
Albumin: 3.8 g/dL (ref 3.5–5.0)
Alkaline Phosphatase: 95 U/L (ref 38–126)
Anion gap: 13 (ref 5–15)
BUN: 8 mg/dL (ref 6–20)
CO2: 20 mmol/L — ABNORMAL LOW (ref 22–32)
Calcium: 9 mg/dL (ref 8.9–10.3)
Chloride: 105 mmol/L (ref 98–111)
Creatinine, Ser: 0.87 mg/dL (ref 0.44–1.00)
GFR, Estimated: >60 mL/min
Glucose, Bld: 107 mg/dL — ABNORMAL HIGH (ref 70–99)
Potassium: 3.6 mmol/L (ref 3.5–5.1)
Sodium: 138 mmol/L (ref 135–145)
Total Bilirubin: 0.2 mg/dL (ref 0.0–1.2)
Total Protein: 7.4 g/dL (ref 6.5–8.1)

## 2024-04-10 LAB — CBC WITH DIFFERENTIAL/PLATELET
Abs Immature Granulocytes: 0.03 K/uL (ref 0.00–0.07)
Basophils Absolute: 0 K/uL (ref 0.0–0.1)
Basophils Relative: 0 %
Eosinophils Absolute: 0 K/uL (ref 0.0–0.5)
Eosinophils Relative: 1 %
HCT: 34.8 % — ABNORMAL LOW (ref 36.0–46.0)
Hemoglobin: 11.4 g/dL — ABNORMAL LOW (ref 12.0–15.0)
Immature Granulocytes: 0 %
Lymphocytes Relative: 37 %
Lymphs Abs: 3.2 K/uL (ref 0.7–4.0)
MCH: 30.2 pg (ref 26.0–34.0)
MCHC: 32.8 g/dL (ref 30.0–36.0)
MCV: 92.1 fL (ref 80.0–100.0)
Monocytes Absolute: 0.7 K/uL (ref 0.1–1.0)
Monocytes Relative: 9 %
Neutro Abs: 4.5 K/uL (ref 1.7–7.7)
Neutrophils Relative %: 53 %
Platelets: 303 K/uL (ref 150–400)
RBC: 3.78 MIL/uL — ABNORMAL LOW (ref 3.87–5.11)
RDW: 13.2 % (ref 11.5–15.5)
WBC: 8.5 K/uL (ref 4.0–10.5)
nRBC: 0 % (ref 0.0–0.2)

## 2024-04-10 LAB — HCG, SERUM, QUALITATIVE: Preg, Serum: NEGATIVE

## 2024-04-10 MED ORDER — LORAZEPAM 2 MG/ML IJ SOLN
INTRAMUSCULAR | Status: AC
  Filled 2024-04-10: qty 1

## 2024-04-10 MED ORDER — LACTATED RINGERS IV BOLUS
1000.0000 mL | Freq: Once | INTRAVENOUS | Status: AC
  Administered 2024-04-10: 1000 mL via INTRAVENOUS

## 2024-04-10 MED ORDER — LORAZEPAM 2 MG/ML IJ SOLN
0.5000 mg | Freq: Once | INTRAMUSCULAR | Status: AC
  Administered 2024-04-10: 0.5 mg via INTRAVENOUS

## 2024-04-10 MED ORDER — LEVETIRACETAM (KEPPRA) 500 MG/5 ML ADULT IV PUSH
1000.0000 mg | Freq: Once | INTRAVENOUS | Status: AC
  Administered 2024-04-10: 1000 mg via INTRAVENOUS
  Filled 2024-04-10: qty 10

## 2024-04-10 MED ORDER — LACTATED RINGERS IV SOLN: INTRAVENOUS | Status: DC

## 2024-04-10 NOTE — ED Provider Notes (Signed)
 " [place] EMERGENCY DEPARTMENT AT [facility] Provider Note   CSN: [id number] Arrival date & time: 04/10/24  9260     Patient presents with: Seizures   Emily Alexander is a 24 y.o. female.   24 year old female with history of seizures presents after having witnessed seizure.  Seizure lasted for approximately 3 minutes and she was postictal afterwards.  Patient reportedly missed her dose of Keppra .  Denies any recent use of alcohol tobacco.  No headaches.  Patient did not lose any bowel or bladder control.  Did not bite her tongue.  EMS was called and blood sugar was over 100 and patient transported.       Prior to Admission medications  Medication Sig Start Date End Date Taking? Authorizing Provider  acetaminophen  (TYLENOL ) 500 MG tablet Take 500 mg by mouth every 6 (six) hours as needed for mild pain or moderate pain.    [provider]  ibuprofen  (ADVIL ) 600 MG tablet Take 1 tablet (600 mg total) by mouth every 6 (six) hours as needed. 11/06/21   Nivia Colon, PA-C  levETIRAcetam  (KEPPRA ) 500 MG tablet Take 1,000 mg by mouth 2 (two) times daily.     [provider]  naproxen  (NAPROSYN ) 500 MG tablet Take 1 tablet (500 mg total) by mouth 2 (two) times daily. 10/20/21   Theadore Ozell HERO, MD  penicillin  v potassium (VEETID) 500 MG tablet Take 1 tablet (500 mg total) by mouth 3 (three) times daily. 11/06/21   Nivia Colon, PA-C    Allergies: Patient has no known allergies.    Review of Systems  All other systems reviewed and are negative.   Updated Vital Signs BP (!) 143/88   Pulse (!) 113   Temp 98.4 F (36.9 C)   Resp 18   SpO2 99%   Physical Exam Vitals and nursing note reviewed.  Constitutional:      General: She is not in acute distress.    Appearance: Normal appearance. She is well-developed. She is not toxic-appearing.  HENT:     Head: Normocephalic and atraumatic.  Eyes:     General: Lids are normal.     Conjunctiva/sclera:  Conjunctivae normal.     Pupils: Pupils are equal, round, and reactive to light.  Neck:     Thyroid: No thyroid mass.     Trachea: No tracheal deviation.  Cardiovascular:     Rate and Rhythm: Normal rate and regular rhythm.     Heart sounds: Normal heart sounds. No murmur heard.    No gallop.  Pulmonary:     Effort: Pulmonary effort is normal. No respiratory distress.     Breath sounds: Normal breath sounds. No stridor. No decreased breath sounds, wheezing, rhonchi or rales.  Abdominal:     General: There is no distension.     Palpations: Abdomen is soft.     Tenderness: There is no abdominal tenderness. There is no rebound.  Musculoskeletal:        General: No tenderness. Normal range of motion.     Cervical back: Normal range of motion and neck supple.  Skin:    General: Skin is warm and dry.     Findings: No abrasion or rash.  Neurological:     General: No focal deficit present.     Mental Status: She is oriented to person, place, and time. Mental status is at baseline. She is lethargic.     GCS: GCS eye subscore is 4. GCS verbal subscore is 5.  GCS motor subscore is 6.     Cranial Nerves: No cranial nerve deficit.     Sensory: No sensory deficit.     Motor: Motor function is intact. No seizure activity.  Psychiatric:        Attention and Perception: Attention normal.        Mood and Affect: Affect is flat.        Speech: Speech is delayed.        Behavior: Behavior is withdrawn.     (all labs ordered are listed, but only abnormal results are displayed) Labs Reviewed - No data to display  EKG: None  Radiology: No results found.   Procedures   Medications Ordered in the ED  lactated ringers  bolus 1,000 mL (has no administration in time range)  lactated ringers  infusion (has no administration in time range)  levETIRAcetam  (KEPPRA ) undiluted injection 1,000 mg (has no administration in time range)  LORazepam  (ATIVAN ) injection 0.5 mg (has no administration in time  range)                                    Medical Decision Making Amount and/or Complexity of Data Reviewed Labs: ordered. ECG/medicine tests: ordered.  Risk Prescription drug management.   Patient given a dose of IV Keppra  here along with Ativan .  Also given IV fluids.  Feels better at this time.  She is back to her baseline.  Will be discharged and will follow-up with her doctor     Final diagnoses:  None    ED Discharge Orders     None          Dasie Faden, MD 04/10/24 1024  "

## 2024-04-10 NOTE — ED Triage Notes (Signed)
 Pt bib ems from home with reports of seizure lasting approx 3 minutes descibed as tonic clonic, still postictal on arrival. Hx seizures, did not take her meds yesterday.   150/90 HR 120 99% RA 20g LAC CBG 118

## 2024-04-10 NOTE — ED Notes (Signed)
 Patient Alert and oriented to baseline. Stable and ambulatory to baseline. Patient verbalized understanding of the discharge instructions.  Patient belongings were taken by the patient.
